# Patient Record
Sex: Male | Born: 1995 | Race: Black or African American | Hispanic: No | Marital: Single | Smoking: Never smoker
Health system: Southern US, Community
[De-identification: ages and names within clinical notes are randomized; demographics above are authoritative.]

---

## 2015-02-24 ENCOUNTER — Emergency Department (HOSPITAL_COMMUNITY)
Admission: EM | Admit: 2015-02-24 | Discharge: 2015-02-24 | Disposition: A | Discharge status: Home / Self Care | Payer: No Typology Code available for payment source | Attending: Emergency Medicine | Admitting: Emergency Medicine

## 2015-02-24 ENCOUNTER — Emergency Department (HOSPITAL_COMMUNITY): Payer: No Typology Code available for payment source

## 2015-02-24 ENCOUNTER — Encounter (HOSPITAL_COMMUNITY): Payer: Self-pay | Admitting: Emergency Medicine

## 2015-02-24 DIAGNOSIS — S8992XA Unspecified injury of left lower leg, initial encounter: Secondary | ICD-10-CM | POA: Diagnosis not present

## 2015-02-24 DIAGNOSIS — Y998 Other external cause status: Secondary | ICD-10-CM | POA: Insufficient documentation

## 2015-02-24 DIAGNOSIS — S8991XA Unspecified injury of right lower leg, initial encounter: Secondary | ICD-10-CM | POA: Insufficient documentation

## 2015-02-24 DIAGNOSIS — Y92410 Unspecified street and highway as the place of occurrence of the external cause: Secondary | ICD-10-CM | POA: Diagnosis not present

## 2015-02-24 DIAGNOSIS — Y9389 Activity, other specified: Secondary | ICD-10-CM | POA: Insufficient documentation

## 2015-02-24 DIAGNOSIS — S161XXA Strain of muscle, fascia and tendon at neck level, initial encounter: Secondary | ICD-10-CM | POA: Insufficient documentation

## 2015-02-24 DIAGNOSIS — S199XXA Unspecified injury of neck, initial encounter: Secondary | ICD-10-CM | POA: Diagnosis present

## 2015-02-24 MED ORDER — HYDROCODONE-ACETAMINOPHEN 5-325 MG PO TABS: 1.0000 | ORAL_TABLET | Freq: Four times a day (QID) | ORAL | Status: AC | PRN
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
## 2015-02-24 NOTE — Discharge Instructions (Signed)
Cervical Strain and Sprain With Rehab  Cervical strain and sprain are injuries that commonly occur with "whiplash" injuries. Whiplash occurs when the neck is forcefully whipped backward or forward, such as during a motor vehicle accident or during contact sports. The muscles, ligaments, tendons, discs, and nerves of the neck are susceptible to injury when this occurs.  RISK FACTORS  Risk of having a whiplash injury increases if:  · Osteoarthritis of the spine.  · Situations that make head or neck accidents or trauma more likely.  · High-risk sports (football, rugby, wrestling, hockey, auto racing, gymnastics, diving, contact karate, or boxing).  · Poor strength and flexibility of the neck.  · Previous neck injury.  · Poor tackling technique.  · Improperly fitted or padded equipment.  SYMPTOMS   · Pain or stiffness in the front or back of neck or both.  · Symptoms may present immediately or up to 24 hours after injury.  · Dizziness, headache, nausea, and vomiting.  · Muscle spasm with soreness and stiffness in the neck.  · Tenderness and swelling at the injury site.  PREVENTION  · Learn and use proper technique (avoid tackling with the head, spearing, and head-butting; use proper falling techniques to avoid landing on the head).  · Warm up and stretch properly before activity.  · Maintain physical fitness:    Strength, flexibility, and endurance.    Cardiovascular fitness.  · Wear properly fitted and padded protective equipment, such as padded soft collars, for participation in contact sports.  PROGNOSIS   Recovery from cervical strain and sprain injuries is dependent on the extent of the injury. These injuries are usually curable in 1 week to 3 months with appropriate treatment.   RELATED COMPLICATIONS   · Temporary numbness and weakness may occur if the nerve roots are damaged, and this may persist until the nerve has completely healed.  · Chronic pain due to frequent recurrence of symptoms.  · Prolonged healing,  especially if activity is resumed too soon (before complete recovery).  TREATMENT   Treatment initially involves the use of ice and medication to help reduce pain and inflammation. It is also important to perform strengthening and stretching exercises and modify activities that worsen symptoms so the injury does not get worse. These exercises may be performed at home or with a therapist. For patients who experience severe symptoms, a soft, padded collar may be recommended to be worn around the neck.   Improving your posture may help reduce symptoms. Posture improvement includes pulling your chin and abdomen in while sitting or standing. If you are sitting, sit in a firm chair with your buttocks against the back of the chair. While sleeping, try replacing your pillow with a small towel rolled to 2 inches in diameter, or use a cervical pillow or soft cervical collar. Poor sleeping positions delay healing.   For patients with nerve root damage, which causes numbness or weakness, the use of a cervical traction apparatus may be recommended. Surgery is rarely necessary for these injuries. However, cervical strain and sprains that are present at birth (congenital) may require surgery.  MEDICATION   · If pain medication is necessary, nonsteroidal anti-inflammatory medications, such as aspirin and ibuprofen, or other minor pain relievers, such as acetaminophen, are often recommended.  · Do not take pain medication for 7 days before surgery.  · Prescription pain relievers may be given if deemed necessary by your caregiver. Use only as directed and only as much as you need.    HEAT AND COLD:   · Cold treatment (icing) relieves pain and reduces inflammation. Cold treatment should be applied for 10 to 15 minutes every 2 to 3 hours for inflammation and pain and immediately after any activity that aggravates your symptoms. Use ice packs or an ice massage.  · Heat treatment may be used prior to performing the stretching and  strengthening activities prescribed by your caregiver, physical therapist, or athletic trainer. Use a heat pack or a warm soak.  SEEK MEDICAL CARE IF:   · Symptoms get worse or do not improve in 2 weeks despite treatment.  · New, unexplained symptoms develop (drugs used in treatment may produce side effects).  EXERCISES  RANGE OF MOTION (ROM) AND STRETCHING EXERCISES - Cervical Strain and Sprain  These exercises may help you when beginning to rehabilitate your injury. In order to successfully resolve your symptoms, you must improve your posture. These exercises are designed to help reduce the forward-head and rounded-shoulder posture which contributes to this condition. Your symptoms may resolve with or without further involvement from your physician, physical therapist or athletic trainer. While completing these exercises, remember:   · Restoring tissue flexibility helps normal motion to return to the joints. This allows healthier, less painful movement and activity.  · An effective stretch should be held for at least 20 seconds, although you may need to begin with shorter hold times for comfort.  · A stretch should never be painful. You should only feel a gentle lengthening or release in the stretched tissue.  STRETCH- Axial Extensors  · Lie on your back on the floor. You may bend your knees for comfort. Place a rolled-up hand towel or dish towel, about 2 inches in diameter, under the part of your head that makes contact with the floor.  · Gently tuck your chin, as if trying to make a "double chin," until you feel a gentle stretch at the base of your head.  · Hold __________ seconds.  Repeat __________ times. Complete this exercise __________ times per day.   STRETCH - Axial Extension   · Stand or sit on a firm surface. Assume a good posture: chest up, shoulders drawn back, abdominal muscles slightly tense, knees unlocked (if standing) and feet hip width apart.  · Slowly retract your chin so your head slides back  and your chin slightly lowers. Continue to look straight ahead.  · You should feel a gentle stretch in the back of your head. Be certain not to feel an aggressive stretch since this can cause headaches later.  · Hold for __________ seconds.  Repeat __________ times. Complete this exercise __________ times per day.  STRETCH - Cervical Side Bend   · Stand or sit on a firm surface. Assume a good posture: chest up, shoulders drawn back, abdominal muscles slightly tense, knees unlocked (if standing) and feet hip width apart.  · Without letting your nose or shoulders move, slowly tip your right / left ear to your shoulder until your feel a gentle stretch in the muscles on the opposite side of your neck.  · Hold __________ seconds.  Repeat __________ times. Complete this exercise __________ times per day.  STRETCH - Cervical Rotators   · Stand or sit on a firm surface. Assume a good posture: chest up, shoulders drawn back, abdominal muscles slightly tense, knees unlocked (if standing) and feet hip width apart.  · Keeping your eyes level with the ground, slowly turn your head until you feel a gentle stretch along   the back and opposite side of your neck.  · Hold __________ seconds.  Repeat __________ times. Complete this exercise __________ times per day.  RANGE OF MOTION - Neck Circles   · Stand or sit on a firm surface. Assume a good posture: chest up, shoulders drawn back, abdominal muscles slightly tense, knees unlocked (if standing) and feet hip width apart.  · Gently roll your head down and around from the back of one shoulder to the back of the other. The motion should never be forced or painful.  · Repeat the motion 10-20 times, or until you feel the neck muscles relax and loosen.  Repeat __________ times. Complete the exercise __________ times per day.  STRENGTHENING EXERCISES - Cervical Strain and Sprain  These exercises may help you when beginning to rehabilitate your injury. They may resolve your symptoms with or  without further involvement from your physician, physical therapist, or athletic trainer. While completing these exercises, remember:   · Muscles can gain both the endurance and the strength needed for everyday activities through controlled exercises.  · Complete these exercises as instructed by your physician, physical therapist, or athletic trainer. Progress the resistance and repetitions only as guided.  · You may experience muscle soreness or fatigue, but the pain or discomfort you are trying to eliminate should never worsen during these exercises. If this pain does worsen, stop and make certain you are following the directions exactly. If the pain is still present after adjustments, discontinue the exercise until you can discuss the trouble with your clinician.  STRENGTH - Cervical Flexors, Isometric  · Face a wall, standing about 6 inches away. Place a small pillow, a ball about 6-8 inches in diameter, or a folded towel between your forehead and the wall.  · Slightly tuck your chin and gently push your forehead into the soft object. Push only with mild to moderate intensity, building up tension gradually. Keep your jaw and forehead relaxed.  · Hold 10 to 20 seconds. Keep your breathing relaxed.  · Release the tension slowly. Relax your neck muscles completely before you start the next repetition.  Repeat __________ times. Complete this exercise __________ times per day.  STRENGTH- Cervical Lateral Flexors, Isometric   · Stand about 6 inches away from a wall. Place a small pillow, a ball about 6-8 inches in diameter, or a folded towel between the side of your head and the wall.  · Slightly tuck your chin and gently tilt your head into the soft object. Push only with mild to moderate intensity, building up tension gradually. Keep your jaw and forehead relaxed.  · Hold 10 to 20 seconds. Keep your breathing relaxed.  · Release the tension slowly. Relax your neck muscles completely before you start the next  repetition.  Repeat __________ times. Complete this exercise __________ times per day.  STRENGTH - Cervical Extensors, Isometric   · Stand about 6 inches away from a wall. Place a small pillow, a ball about 6-8 inches in diameter, or a folded towel between the back of your head and the wall.  · Slightly tuck your chin and gently tilt your head back into the soft object. Push only with mild to moderate intensity, building up tension gradually. Keep your jaw and forehead relaxed.  · Hold 10 to 20 seconds. Keep your breathing relaxed.  · Release the tension slowly. Relax your neck muscles completely before you start the next repetition.  Repeat __________ times. Complete this exercise __________ times per day.    POSTURE AND BODY MECHANICS CONSIDERATIONS - Cervical Strain and Sprain  Keeping correct posture when sitting, standing or completing your activities will reduce the stress put on different body tissues, allowing injured tissues a chance to heal and limiting painful experiences. The following are general guidelines for improved posture. Your physician or physical therapist will provide you with any instructions specific to your needs. While reading these guidelines, remember:  · The exercises prescribed by your provider will help you have the flexibility and strength to maintain correct postures.  · The correct posture provides the optimal environment for your joints to work. All of your joints have less wear and tear when properly supported by a spine with good posture. This means you will experience a healthier, less painful body.  · Correct posture must be practiced with all of your activities, especially prolonged sitting and standing. Correct posture is as important when doing repetitive low-stress activities (typing) as it is when doing a single heavy-load activity (lifting).  PROLONGED STANDING WHILE SLIGHTLY LEANING FORWARD  When completing a task that requires you to lean forward while standing in one  place for a long time, place either foot up on a stationary 2- to 4-inch high object to help maintain the best posture. When both feet are on the ground, the low back tends to lose its slight inward curve. If this curve flattens (or becomes too large), then the back and your other joints will experience too much stress, fatigue more quickly, and can cause pain.   RESTING POSITIONS  Consider which positions are most painful for you when choosing a resting position. If you have pain with flexion-based activities (sitting, bending, stooping, squatting), choose a position that allows you to rest in a less flexed posture. You would want to avoid curling into a fetal position on your side. If your pain worsens with extension-based activities (prolonged standing, working overhead), avoid resting in an extended position such as sleeping on your stomach. Most people will find more comfort when they rest with their spine in a more neutral position, neither too rounded nor too arched. Lying on a non-sagging bed on your side with a pillow between your knees, or on your back with a pillow under your knees will often provide some relief. Keep in mind, being in any one position for a prolonged period of time, no matter how correct your posture, can still lead to stiffness.  WALKING  Walk with an upright posture. Your ears, shoulders, and hips should all line up.  OFFICE WORK  When working at a desk, create an environment that supports good, upright posture. Without extra support, muscles fatigue and lead to excessive strain on joints and other tissues.  CHAIR:  · A chair should be able to slide under your desk when your back makes contact with the back of the chair. This allows you to work closely.  · The chair's height should allow your eyes to be level with the upper part of your monitor and your hands to be slightly lower than your elbows.  · Body position:    Your feet should make contact with the floor. If this is not  possible, use a foot rest.    Keep your ears over your shoulders. This will reduce stress on your neck and low back.     This information is not intended to replace advice given to you by your health care provider. Make sure you discuss any questions you have with your health care provider.       Document Released: 03/14/2005 Document Revised: 04/04/2014 Document Reviewed: 06/26/2008  [employer]vier Interactive Patient Education ©2016 [employer]
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
## 2015-03-02 ENCOUNTER — Encounter (HOSPITAL_COMMUNITY): Payer: Self-pay | Admitting: Emergency Medicine

## 2015-03-02 ENCOUNTER — Emergency Department (HOSPITAL_COMMUNITY): Payer: No Typology Code available for payment source

## 2015-03-02 ENCOUNTER — Emergency Department (HOSPITAL_COMMUNITY)
Admission: EM | Admit: 2015-03-02 | Discharge: 2015-03-02 | Disposition: A | Discharge status: Home / Self Care | Payer: No Typology Code available for payment source | Attending: Emergency Medicine | Admitting: Emergency Medicine

## 2015-03-02 DIAGNOSIS — S199XXA Unspecified injury of neck, initial encounter: Secondary | ICD-10-CM | POA: Diagnosis not present

## 2015-03-02 DIAGNOSIS — S0990XA Unspecified injury of head, initial encounter: Secondary | ICD-10-CM | POA: Insufficient documentation

## 2015-03-02 DIAGNOSIS — S39012A Strain of muscle, fascia and tendon of lower back, initial encounter: Secondary | ICD-10-CM | POA: Insufficient documentation

## 2015-03-02 DIAGNOSIS — Y998 Other external cause status: Secondary | ICD-10-CM | POA: Insufficient documentation

## 2015-03-02 DIAGNOSIS — Y9389 Activity, other specified: Secondary | ICD-10-CM | POA: Diagnosis not present

## 2015-03-02 DIAGNOSIS — Y92410 Unspecified street and highway as the place of occurrence of the external cause: Secondary | ICD-10-CM | POA: Insufficient documentation

## 2015-03-02 DIAGNOSIS — S3992XA Unspecified injury of lower back, initial encounter: Secondary | ICD-10-CM | POA: Diagnosis present

## 2015-03-02 MED ORDER — CYCLOBENZAPRINE HCL 10 MG PO TABS: 10.0000 mg | ORAL_TABLET | Freq: Two times a day (BID) | ORAL | Status: AC | PRN

## 2015-03-02 NOTE — Discharge Instructions (Signed)
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
